# Patient Record
Sex: Female | Born: 1965 | Race: White | Hispanic: No | Marital: Single | Smoking: Current every day smoker
Health system: Southern US, Community
[De-identification: ages and names within clinical notes are randomized; demographics above are authoritative.]

## PROBLEM LIST (undated history)

## (undated) HISTORY — PX: CHOLECYSTECTOMY: SHX55

---

## 2009-08-05 ENCOUNTER — Emergency Department: Payer: Self-pay | Admitting: Emergency Medicine

## 2017-01-23 ENCOUNTER — Encounter: Payer: Self-pay | Admitting: Emergency Medicine

## 2017-01-23 ENCOUNTER — Emergency Department
Admission: EM | Admit: 2017-01-23 | Discharge: 2017-01-23 | Discharge status: Against Medical Advice | Payer: Self-pay | Attending: Emergency Medicine | Admitting: Emergency Medicine

## 2017-01-23 ENCOUNTER — Emergency Department: Payer: Self-pay

## 2017-01-23 DIAGNOSIS — Z5321 Procedure and treatment not carried out due to patient leaving prior to being seen by health care provider: Secondary | ICD-10-CM | POA: Insufficient documentation

## 2017-01-23 DIAGNOSIS — R112 Nausea with vomiting, unspecified: Secondary | ICD-10-CM | POA: Insufficient documentation

## 2017-01-23 DIAGNOSIS — R51 Headache: Secondary | ICD-10-CM | POA: Insufficient documentation

## 2017-01-23 MED ORDER — OXYCODONE-ACETAMINOPHEN 5-325 MG PO TABS
1.0000 | ORAL_TABLET | Freq: Once | ORAL | Status: AC
  Administered 2017-01-23: 1 via ORAL
  Filled 2017-01-23: qty 1

## 2017-01-23 MED ORDER — ONDANSETRON 4 MG PO TBDP
4.0000 mg | ORAL_TABLET | Freq: Once | ORAL | Status: AC
Start: 2017-01-23 — End: 2017-01-23
  Administered 2017-01-23: 4 mg via ORAL
  Filled 2017-01-23: qty 1

## 2017-01-23 NOTE — ED Triage Notes (Addendum)
Pt presents to ED via EMS with severe generalized headache with nausea and vomiting X3-4. Ambulatory to triage with steady gait. Onset approx 6 hours ago while having a bowel movement. Pt crying and stomping her feet in triage while answering triage questions. States she has had a similar headache 3 other times while having a bowel movement. Pt took Excedrin at home with no relief.

## 2017-01-23 NOTE — ED Notes (Signed)
Pt calling out from sub-wait area asking for pain medication for her headache.

## 2017-01-23 NOTE — ED Notes (Signed)
Pt resting quietly on recliner in sub-wait with eyes closed and even respirations. No distress noted at this time. Lights off to enhance rest and comfort. Call bell within reach.

## 2017-01-23 NOTE — ED Notes (Signed)
Pt states she is leaving. Pt informed she is not to drive home after percocet administration for 4 hours. Pt states she is not driving. Pt refuses to allow rn to obtain vital signs, ama paperwork. Pt states "no one has done anything for me since i've been here. I walked around and saw all these empty rooms and you people have me lying here in a recliner." Triage RN and charge RN dawn and lea informed pt is leaving after percocet administration. Pt states "this is a terrible hospital, making people wait in recliners and not beds, I've been here four hours and no one has done anything for me."

## 2017-01-23 NOTE — ED Notes (Signed)
Pt given two blankets per her request so she is able to rest while waiting in sub-wait for an available room.

## 2017-11-15 IMAGING — CT CT HEAD W/O CM
3 series · 16 of 47 positions shown, 19 images · non-contrast
Comparison: None.

CLINICAL DATA: Generalized headache with nausea and vomiting

EXAM:
CT HEAD WITHOUT CONTRAST
TECHNIQUE: Contiguous axial images were obtained from the base of the skull
through the vertex without intravenous contrast.

[Series 2: head wo · axial · 0.43mm/px · z∈[-221,-91]mm · 10 of 32 slices shown, 13 images]
[im 3/32  brain]
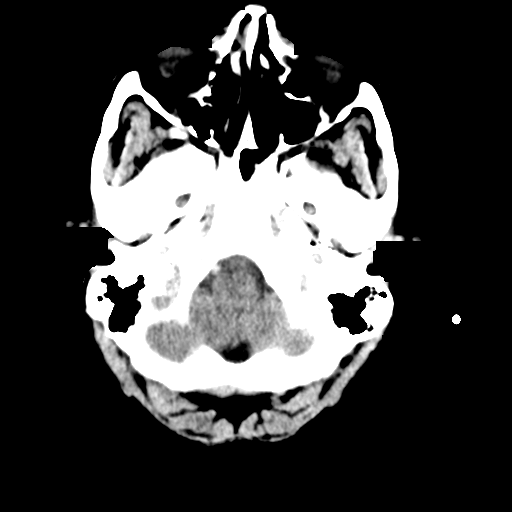
[im 3/32  bone]
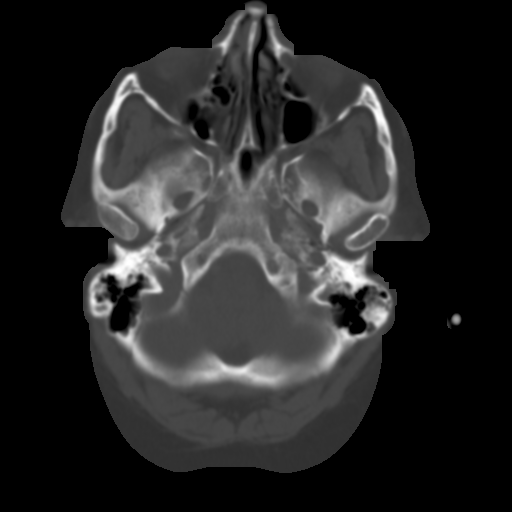
[im 6/32  brain]
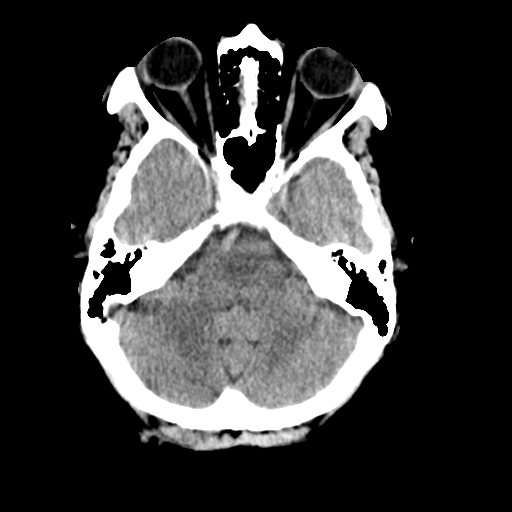
[im 9/32  brain]
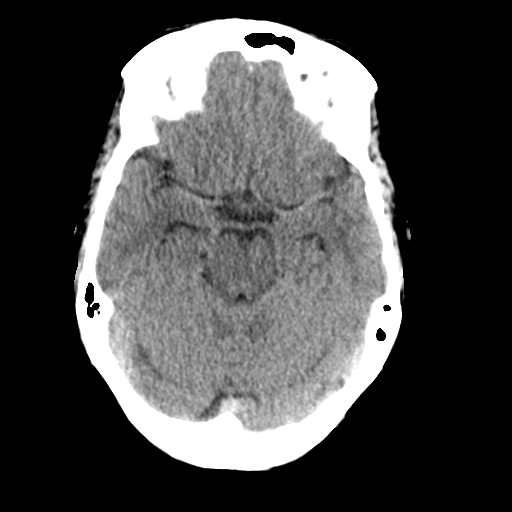
[im 11/32  brain]
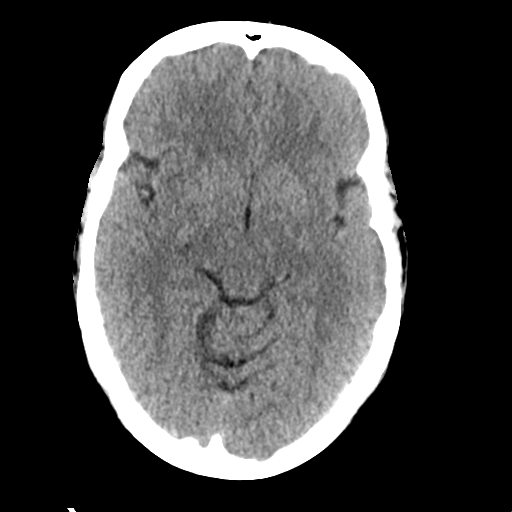
[im 14/32  brain]
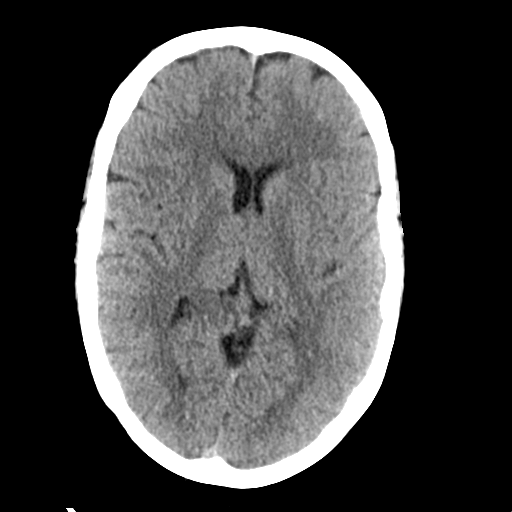
[im 14/32  bone]
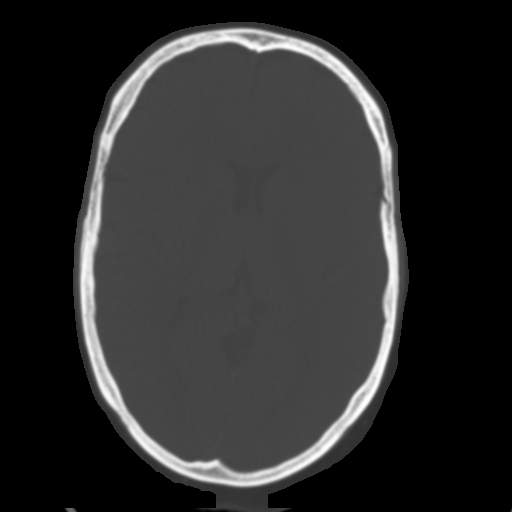
[im 18/32  brain]
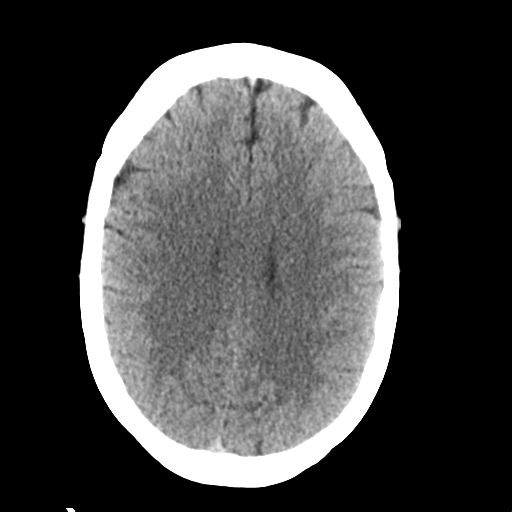
[im 21/32  brain]
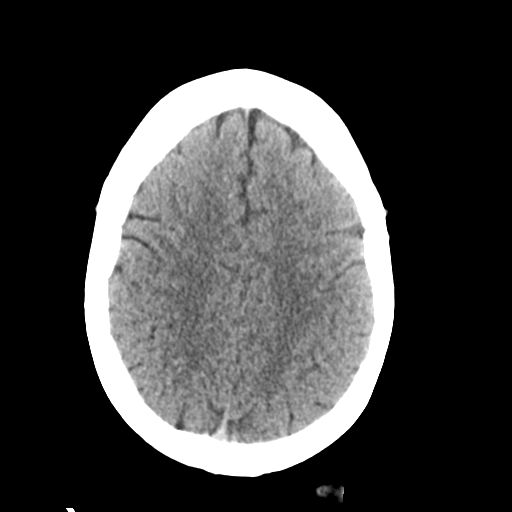
[im 24/32  brain]
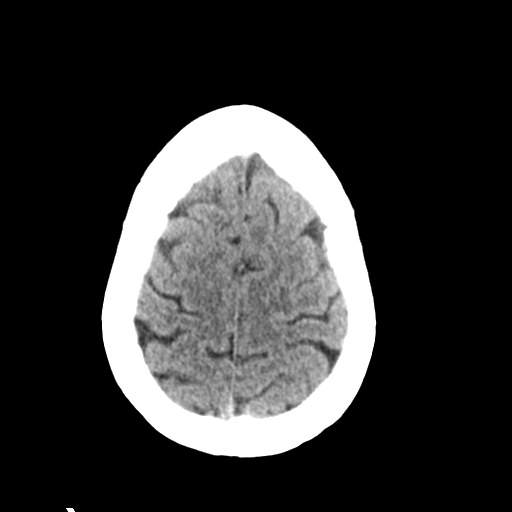
[im 26/32  brain]
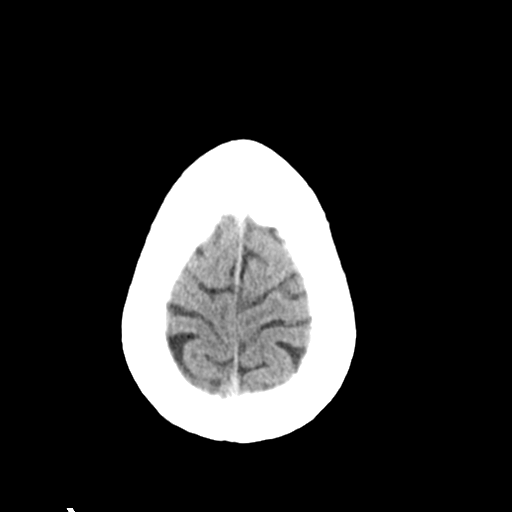
[im 26/32  bone]
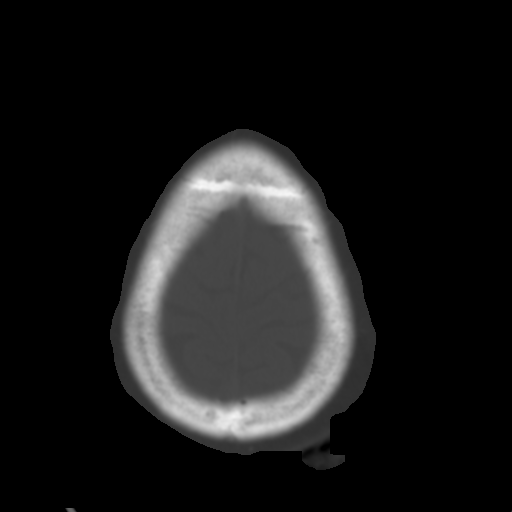
[im 29/32  brain]
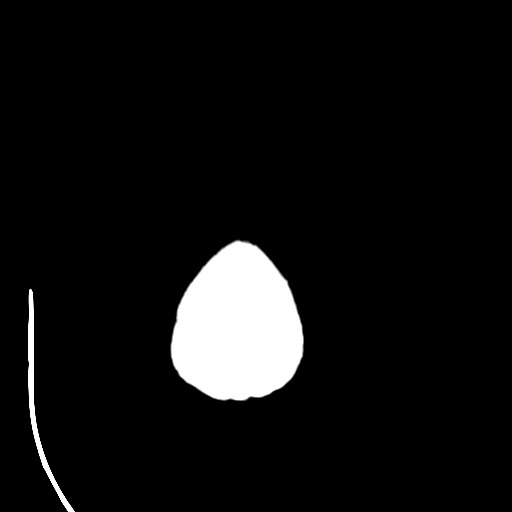

[Series 4: coronal soft tissue · coronal · 0.29mm/px · 3 of 67 slices shown]
[im 23/67  brain]
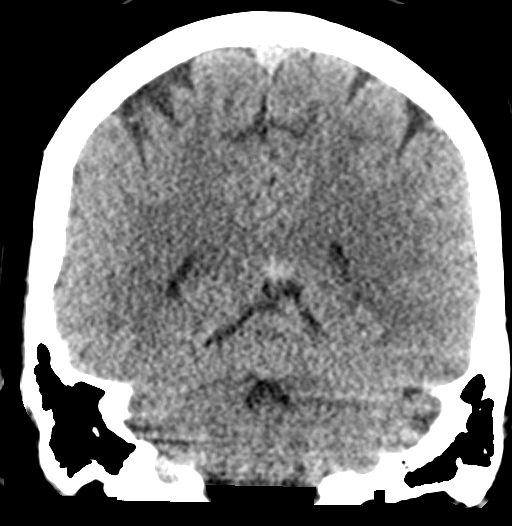
[im 30/67  brain]
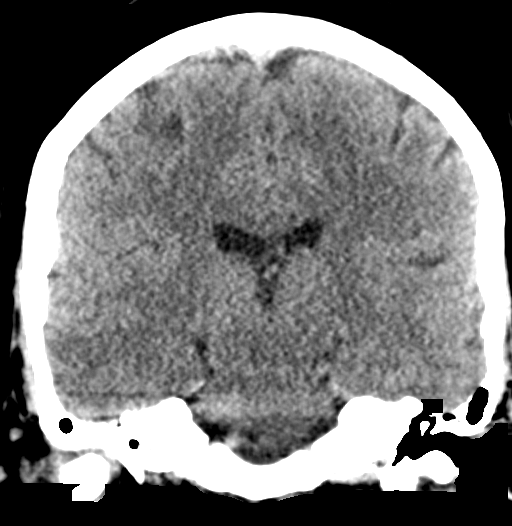
[im 37/67  brain]
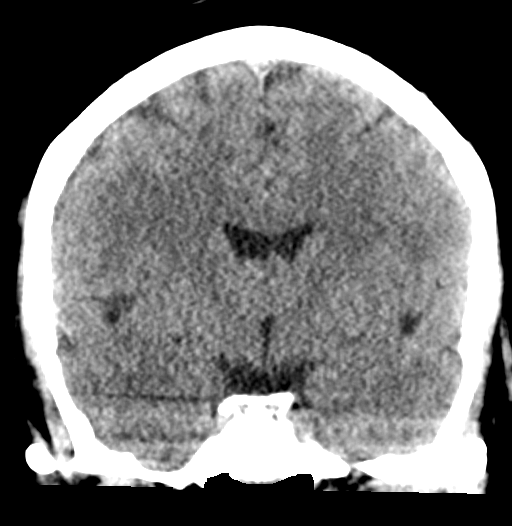

[Series 5: sagittal soft tissue · sagittal · 0.30mm/px · 3 of 50 slices shown]
[im 17/50  brain]
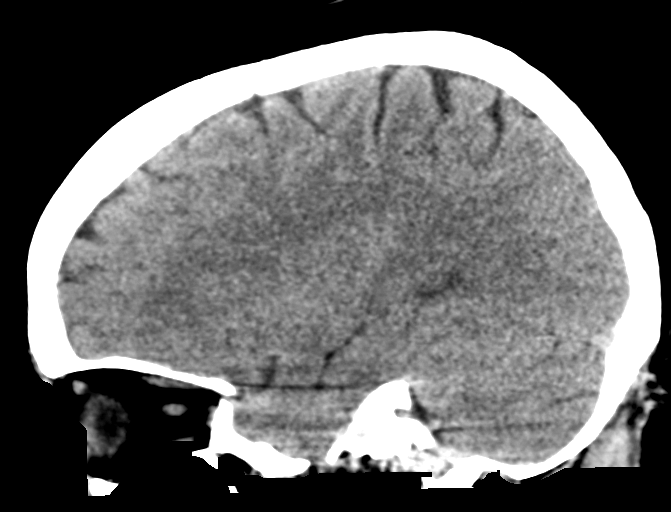
[im 25/50  brain]
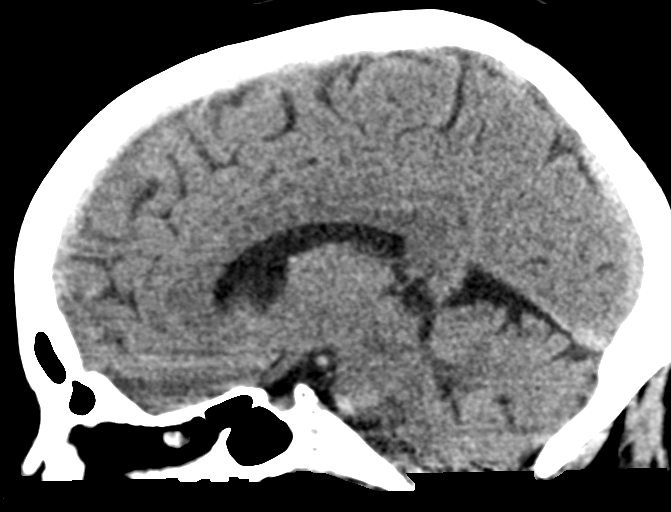
[im 33/50  brain]
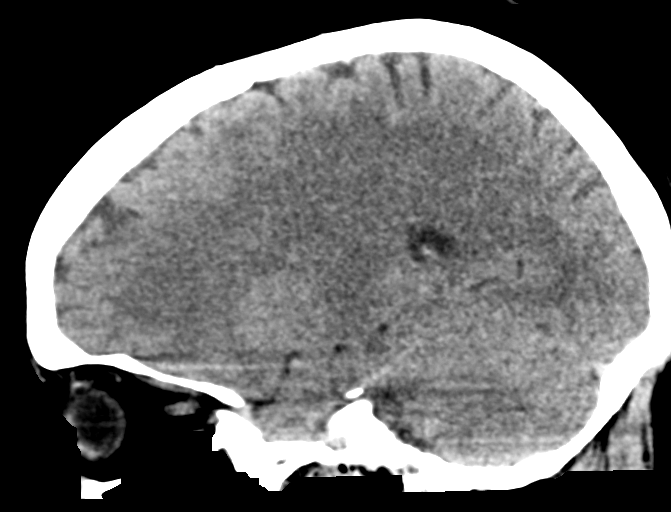

[16 of 47 positions shown; findings below may reference images not displayed]

FINDINGS: Brain: No evidence of acute infarction, hemorrhage, hydrocephalus,
extra-axial collection or mass lesion/mass effect.

Vascular: No hyperdense vessel. Scattered calcifications at the
carotid siphons.

Skull: Normal. Negative for fracture or focal lesion.

Sinuses/Orbits: Mucosal thickening in the sphenoid and ethmoid
sinuses. No acute orbital abnormality.

Other: None
IMPRESSION: No definite CT evidence for acute intracranial abnormality.
